# Patient Record
Sex: Male | Born: 1985 | Race: White | Hispanic: No | Marital: Single | Smoking: Current every day smoker
Health system: Southern US, Community
[De-identification: ages and names within clinical notes are randomized; demographics above are authoritative.]

## PROBLEM LIST (undated history)

## (undated) DIAGNOSIS — F32A Depression, unspecified: Secondary | ICD-10-CM

## (undated) HISTORY — DX: Depression, unspecified: F32.A

---

## 2020-03-22 ENCOUNTER — Emergency Department (HOSPITAL_COMMUNITY)
Admission: EM | Admit: 2020-03-22 | Discharge: 2020-03-23 | Disposition: A | Discharge status: Other Acute Inpatient Hospital | Payer: Self-pay | Attending: Emergency Medicine | Admitting: Emergency Medicine

## 2020-03-22 ENCOUNTER — Encounter (HOSPITAL_COMMUNITY): Payer: Self-pay

## 2020-03-22 ENCOUNTER — Emergency Department (HOSPITAL_COMMUNITY): Payer: Self-pay

## 2020-03-22 DIAGNOSIS — Z20822 Contact with and (suspected) exposure to covid-19: Secondary | ICD-10-CM | POA: Insufficient documentation

## 2020-03-22 DIAGNOSIS — R079 Chest pain, unspecified: Secondary | ICD-10-CM | POA: Insufficient documentation

## 2020-03-22 DIAGNOSIS — R45851 Suicidal ideations: Secondary | ICD-10-CM | POA: Insufficient documentation

## 2020-03-22 DIAGNOSIS — F1420 Cocaine dependence, uncomplicated: Secondary | ICD-10-CM | POA: Insufficient documentation

## 2020-03-22 DIAGNOSIS — F1410 Cocaine abuse, uncomplicated: Secondary | ICD-10-CM

## 2020-03-22 DIAGNOSIS — F329 Major depressive disorder, single episode, unspecified: Secondary | ICD-10-CM | POA: Insufficient documentation

## 2020-03-22 DIAGNOSIS — F17200 Nicotine dependence, unspecified, uncomplicated: Secondary | ICD-10-CM | POA: Insufficient documentation

## 2020-03-22 DIAGNOSIS — R0789 Other chest pain: Secondary | ICD-10-CM

## 2020-03-22 LAB — CBC
HCT: 37.7 % — ABNORMAL LOW (ref 39.0–52.0)
Hemoglobin: 12.1 g/dL — ABNORMAL LOW (ref 13.0–17.0)
MCH: 27.1 pg (ref 26.0–34.0)
MCHC: 32.1 g/dL (ref 30.0–36.0)
MCV: 84.5 fL (ref 80.0–100.0)
Platelets: 228 10*3/uL (ref 150–400)
RBC: 4.46 MIL/uL (ref 4.22–5.81)
RDW: 12.7 % (ref 11.5–15.5)
WBC: 8.7 10*3/uL (ref 4.0–10.5)
nRBC: 0 % (ref 0.0–0.2)

## 2020-03-22 NOTE — ED Triage Notes (Signed)
Pt BIB [facility] for eval of chest pain. 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 Pt reports he had a recent death in the family, got upset, noted chest pain and walked to the fire department. Upon entering the ambulance, pt requested they take him to [facility]. Paramedic declined and pt stated "well how close can you get me?". EMS reports pt slept soundly all the way here, denies CP on arrival.

## 2020-03-23 ENCOUNTER — Other Ambulatory Visit: Payer: Self-pay

## 2020-03-23 ENCOUNTER — Ambulatory Visit (HOSPITAL_COMMUNITY)
Admission: EM | Admit: 2020-03-23 | Discharge: 2020-03-23 | Payer: No Payment, Other | Attending: Psychiatry | Admitting: Psychiatry

## 2020-03-23 ENCOUNTER — Encounter (HOSPITAL_COMMUNITY): Payer: Self-pay

## 2020-03-23 DIAGNOSIS — F332 Major depressive disorder, recurrent severe without psychotic features: Secondary | ICD-10-CM | POA: Insufficient documentation

## 2020-03-23 DIAGNOSIS — F1490 Cocaine use, unspecified, uncomplicated: Secondary | ICD-10-CM | POA: Insufficient documentation

## 2020-03-23 DIAGNOSIS — Z635 Disruption of family by separation and divorce: Secondary | ICD-10-CM | POA: Insufficient documentation

## 2020-03-23 DIAGNOSIS — F17200 Nicotine dependence, unspecified, uncomplicated: Secondary | ICD-10-CM | POA: Insufficient documentation

## 2020-03-23 DIAGNOSIS — R45851 Suicidal ideations: Secondary | ICD-10-CM | POA: Insufficient documentation

## 2020-03-23 DIAGNOSIS — Z560 Unemployment, unspecified: Secondary | ICD-10-CM | POA: Insufficient documentation

## 2020-03-23 DIAGNOSIS — Z590 Homelessness: Secondary | ICD-10-CM | POA: Insufficient documentation

## 2020-03-23 DIAGNOSIS — Z915 Personal history of self-harm: Secondary | ICD-10-CM | POA: Insufficient documentation

## 2020-03-23 DIAGNOSIS — Z634 Disappearance and death of family member: Secondary | ICD-10-CM | POA: Insufficient documentation

## 2020-03-23 DIAGNOSIS — F1120 Opioid dependence, uncomplicated: Secondary | ICD-10-CM | POA: Insufficient documentation

## 2020-03-23 LAB — RAPID URINE DRUG SCREEN, HOSP PERFORMED
Amphetamines: NOT DETECTED
Barbiturates: NOT DETECTED
Benzodiazepines: NOT DETECTED
Cocaine: POSITIVE — AB
Opiates: NOT DETECTED
Tetrahydrocannabinol: NOT DETECTED

## 2020-03-23 LAB — TROPONIN I (HIGH SENSITIVITY)
Troponin I (High Sensitivity): 3 ng/L (ref <18)
Troponin I (High Sensitivity): 3 ng/L (ref <18)

## 2020-03-23 LAB — BASIC METABOLIC PANEL
Anion gap: 9 (ref 5–15)
BUN: 8 mg/dL (ref 6–20)
CO2: 27 mmol/L (ref 22–32)
Calcium: 8.8 mg/dL — ABNORMAL LOW (ref 8.9–10.3)
Chloride: 101 mmol/L (ref 98–111)
Creatinine, Ser: 1.16 mg/dL (ref 0.61–1.24)
GFR calc Af Amer: >60 mL/min (ref >60)
GFR calc non Af Amer: >60 mL/min (ref >60)
Glucose, Bld: 109 mg/dL — ABNORMAL HIGH (ref 70–99)
Potassium: 4.2 mmol/L (ref 3.5–5.1)
Sodium: 137 mmol/L (ref 135–145)

## 2020-03-23 LAB — SARS CORONAVIRUS 2 BY RT PCR (HOSPITAL ORDER, PERFORMED IN ~~LOC~~ HOSPITAL LAB): SARS Coronavirus 2: NEGATIVE

## 2020-03-23 LAB — ETHANOL: Alcohol, Ethyl (B): <10 mg/dL (ref <10)

## 2020-03-23 MED ORDER — ALUM & MAG HYDROXIDE-SIMETH 200-200-20 MG/5ML PO SUSP: 30.0000 mL | ORAL | Status: DC | PRN

## 2020-03-23 MED ORDER — ACETAMINOPHEN 325 MG PO TABS: 650.0000 mg | ORAL_TABLET | Freq: Four times a day (QID) | ORAL | Status: DC | PRN

## 2020-03-23 MED ORDER — GABAPENTIN 600 MG PO TABS
600.0000 mg | ORAL_TABLET | Freq: Three times a day (TID) | ORAL | Status: DC
  Administered 2020-03-23: 600 mg via ORAL
  Filled 2020-03-23: qty 1

## 2020-03-23 MED ORDER — MAGNESIUM HYDROXIDE 400 MG/5ML PO SUSP: 30.0000 mL | Freq: Every day | ORAL | Status: DC | PRN

## 2020-03-23 MED ORDER — SERTRALINE HCL 50 MG PO TABS
50.0000 mg | ORAL_TABLET | Freq: Every day | ORAL | Status: DC
  Administered 2020-03-23: 50 mg via ORAL
  Filled 2020-03-23: qty 1

## 2020-03-23 NOTE — BH Assessment (Signed)
Comprehensive Clinical Assessment (CCA) Screening, Triage and Referral Note  03/23/2020 Raymond Villarreal [id number]    Raymond Villarreal is a 34 y.o. male with a history of depression who presents to [facility] via [facility] for suicidal thoughts. Pt states that he has been feeling suicidal last few weeks due to recent death in his family, his nephew died 4 days ago. Pt also states that he is currently going through a divorce with wife over the last month as well. Pt states he is currently unemployed , homeless and recently discharged from [facility] a few weeks ago. Pt currently endorses SI with a plan to overdose on his sleeping medications. Pt reports 1 past SI attempt 1 year go. Pt denies HI, AVH, SIB. Pt reports current drug abuse of cocaine, states he last used a half a gram of cocaine last night, he states he occasionally uses but not daily. Pt reports past use of Heroin, states he has been clean 16 months, has been attending Methadone clinic states he goes daily has been taking 130 mg of methadone daily. Pt denies alcohol use. Reports that he is prescribed sertraline 50 mg daily, gabapentin 600 mg TID, and latuda. He is unsure of the dose for latuda. States that he receives his medications from [facility] in [place]. Chart review indicates that the patient was hospitalized at [facility] from 02/23/2020 to 02/29/2020 for depression/SI and then transferred to [facility] on 02/29/2020 for continued psychiatric treatment.  Pt reports depressive symptoms: anxiety, isolation, worthlessness, hopelessness, fatigue, loss of appetite. Pt reports getting 6 to 7 hours of sleep daily, with a poor appetite, no weight loss or gain. Pt denies any trauma/abuse, but reports family history of bipolar disorder and suicidal ideation on maternal side. Pt reports no current criminal charges, no access to weapons at this time, can not contract for safety.     Disposition: Nira Conn, FNP recommends pt for overnight  observation/contious assessment at [facility]  Diagnosis: MDD,recurrent, severe    Visit Diagnosis:    ICD-10-CM   1. Severe recurrent major depression without psychotic features [facility])  F33.2      Patient Reported Information How did you hear about [place]? Other (Comment) (EMS)  Referral name: EMS  Referral phone number: No data recorded  Whom do you see for routine medical problems? Hospital ER;I don't have a doctor  Practice/Facility Name: No data recorded Practice/Facility Phone Number: No data recorded Name of Contact: No data recorded Contact Number: No data recorded Contact Fax Number: No data recorded Prescriber Name: No data recorded Prescriber Address (if known): No data recorded  What Is the Reason for Your Visit/Call Today? suicidal  How Long Has This Been Causing You Problems? 1 wk - 1 month  What Do You Feel Would Help You the Most Today? Assessment Only   Have You Recently Been in Any Inpatient Treatment (Hospital/Detox/Crisis Center/28-Day Program)? No  Name/Location of Program/Hospital:No data recorded How Long Were You There? No data recorded When Were You Discharged? No data recorded  Have You Ever Received Services From [facility] Before? No  Who Do You See at [facility]? No data recorded  Have You Recently Had Any Thoughts About Hurting Yourself? Yes  Are You Planning to Commit Suicide/Harm Yourself At This time? Yes   Have you Recently Had Thoughts About Hurting Someone Karolee Ohs? No  Explanation: No data recorded  Have You Used Any Alcohol or Drugs in the Past 24 Hours? Yes  How Long Ago Did You Use Drugs  or Alcohol? No data recorded What Did You Use and How Much? cocaine about a half a gram   Do You Currently Have a Therapist/Psychiatrist? Yes  Name of Therapist/Psychiatrist: [facility]  Have You Been Recently Discharged From Any Office Practice or Programs? No  Explanation of Discharge From Practice/Program: No data recorded    CCA  Screening Triage Referral Assessment Type of Contact: Face-to-Face  Is this Initial or Reassessment? Initial Date Telepsych consult ordered in [facility]:  03/23/20 Time Telepsych consult ordered in [facility]:  0600  Patient Reported Information Reviewed? Yes  Patient Left Without Being Seen? NO Reason for Not Completing Assessment: No data recorded  Collateral Involvement: none   Does Patient Have a Court Appointed Legal Guardian? NO  Name and Contact of Legal Guardian: No data recorded If Minor and Not Living with Parent(s), Who has Custody? No data recorded Is CPS involved or ever been involved? Never  Is APS involved or ever been involved? Never   Patient Determined To Be At Risk for Harm To Self or Others Based on Review of Patient Reported Information or Presenting Complaint? No  Method: No data recorded Availability of Means: No data recorded Intent: No data recorded Notification Required: No data recorded Additional Information for Danger to Others Potential: No data recorded Additional Comments for Danger to Others Potential: No data recorded Are There Guns or Other Weapons in Your Home? No data recorded Types of Guns/Weapons: No data recorded Are These Weapons Safely Secured?                            No data recorded Who Could Verify You Are Able To Have These Secured: No data recorded Do You Have any Outstanding Charges, Pending Court Dates, Parole/Probation? No data recorded Contacted To Inform of Risk of Harm To Self or Others: No data recorded  Location of Assessment: [facility]  Does Patient Present under Involuntary Commitment? No  IVC Papers Initial File Date: No data recorded  [place] of Residence: [place]   Patient Currently Receiving the Following Services: Individual Therapy;Medication Management   Determination of Need: Urgent (48 hours)   Options For Referral: Overnight Observation    CCA Biopsychosocial  Intake/Chief Complaint:  CCA Intake With Chief  Complaint CCA Part Two Date: 03/23/20 CCA Part Two Time: 0601 Chief Complaint/Presenting Problem: suicidal (suicidal)  Mental Health Symptoms Depression:  Depression: Hopelessness, Irritability, Change in energy/activity, Difficulty Concentrating, Increase/decrease in appetite, Worthlessness, Duration of symptoms greater than two weeks  Mania:  Mania: None  Anxiety:   Anxiety: Difficulty concentrating, Worrying, Irritability  Psychosis:  Psychosis: None  Trauma:  Trauma: None  Obsessions:  Obsessions: None  Compulsions:  Compulsions: None  Inattention:  Inattention: None  Hyperactivity/Impulsivity:     Oppositional/Defiant Behaviors:  Oppositional/Defiant Behaviors: None  Emotional Irregularity:  Emotional Irregularity: None  Other Mood/Personality Symptoms:      Mental Status Exam Appearance and self-care  Stature:  Stature: Average  Weight:  Weight: Average weight  Clothing:  Clothing: Casual  Grooming:  Grooming: Normal  Cosmetic use:  Cosmetic Use: Age appropriate  Posture/gait:  Posture/Gait: Normal  Motor activity:  Motor Activity:  (NORMAL)  Sensorium  Attention:  Attention: Normal  Concentration:  Concentration: Normal  Orientation:  Orientation: Situation, Time, Person, Object  Recall/memory:  Recall/Memory: Normal  Affect and Mood  Affect:  Affect: Flat  Mood:  Mood: Euthymic  Relating  Eye contact:  Eye Contact: Normal  Facial expression:  Facial  Expression: Tense  Attitude toward examiner:  Attitude Toward Examiner: Cooperative  Thought and Language  Speech flow: Speech Flow: Clear and Coherent  Thought content:  Thought Content: Appropriate to Mood and Circumstances  Preoccupation:  Preoccupations: None  Hallucinations:  Hallucinations: None  Organization:     Company secretaryxecutive Functions  Fund of Knowledge:  Fund of Knowledge: Good  Intelligence:  Intelligence: Average  Abstraction:  Abstraction: Development worker, international aidConcrete  Judgement:  Judgement: Good  Reality Testing:  Reality  Testing: Adequate  Insight:  Insight: Good  Decision Making:  Decision Making: Normal  Social Functioning  Social Maturity:  Social Maturity: Responsible  Social Judgement:  Social Judgement: Normal  Stress  Stressors:  Stressors: Work, Engineer, civil (consulting)Housing, Surveyor, quantityinancial, Other (Comment), Grief/losses, Transitions  Coping Ability:  Coping Ability: Horticulturist, commercialxhausted  Skill Deficits:  Skill Deficits: None  Supports:  Supports: Support needed     Religion: Religion/Spirituality Are You A Religious Person?: Yes What is Your Religious Affiliation?: ChiropodistChristian  Leisure/Recreation: Leisure / Recreation Do You Have Hobbies?: No  Exercise/Diet: Exercise/Diet Do You Exercise?: Yes What Type of Exercise Do You Do?: Run/Walk How Many Times a Week Do You Exercise?: 1-3 times a week Have You Gained or Lost A Significant Amount of Weight in the Past Six Months?: No Do You Follow a Special Diet?: No Do You Have Any Trouble Sleeping?: No   CCA Employment/Education  Employment/Work Situation: Employment / Work Psychologist, occupationalituation Employment situation: Biomedical scientistUnemployed Patient's job has been impacted by current illness: No Has patient ever been in the Eli Lilly and Companymilitary?: No  Education: Education Is Patient Currently Attending School?: No Last Grade Completed: 8 Did Garment/textile technologistYou Graduate From [employer]High School?: No Did Theme park managerYou Attend College?: No Did Designer, television/film setYou Attend Graduate School?: No Did You Have An Individualized Education Program (IIEP): No Did You Have Any Difficulty At Progress EnergySchool?: No Patient's Education Has Been Impacted by Current Illness: No   CCA Family/Childhood History  Family and Relationship History: Family history Marital status: Single Are you sexually active?: No What is your sexual orientation?:  (Straight) Does patient have children?: No  Childhood History:  Childhood History By whom was/is the patient raised?: Mother, Father Does patient have siblings?: Yes Number of Siblings: 2 Description of patient's current relationship  with siblings: fair Did patient suffer any verbal/emotional/physical/sexual abuse as a child?: No Did patient suffer from severe childhood neglect?: No Has patient ever been sexually abused/assaulted/raped as an adolescent or adult?: No Was the patient ever a victim of a crime or a disaster?: No Witnessed domestic violence?: No Has patient been affected by domestic violence as an adult?: No  Child/Adolescent Assessment:     CCA Substance Use  Alcohol/Drug Use: Alcohol / Drug Use Pain Medications: see MAR History of alcohol / drug use?: Yes Substance #1 Name of Substance 1: Cocaine 1 - Age of First Use: 14 1 - Frequency: sometimes 1 - Last Use / Amount: yesterday/ half a gram Substance #2 Name of Substance 2: Heroin 2 - Age of First Use: 17 2 - Last Use / Amount: 16 months ago                     ASAM's:  Six Dimensions of Multidimensional Assessment  Dimension 1:  Acute Intoxication and/or Withdrawal Potential:      Dimension 2:  Biomedical Conditions and Complications:      Dimension 3:  Emotional, Behavioral, or Cognitive Conditions and Complications:     Dimension 4:  Readiness to Change:     Dimension 5:  Relapse, Continued use, or Continued Problem Potential:     Dimension 6:  Recovery/Living Environment:     ASAM Severity Score:    ASAM Recommended Level of Treatment:     Substance use Disorder (SUD) Substance Use Disorder (SUD)  Checklist Symptoms of Substance Use: Large amounts of time spent to obtain, use or recover from the substance(s)  Recommendations for Services/Supports/Treatments: Recommendations for Services/Supports/Treatments Recommendations For Services/Supports/Treatments: Other (Comment)  DSM5 Diagnoses: There are no problems to display for this patient.   Patient Centered Plan: Patient is on the following Treatment Plan(s):     Referrals to Alternative Service(s): Referred to Alternative Service(s):   Place:   Date:   Time:     Referred to Alternative Service(s):   Place:   Date:   Time:    Referred to Alternative Service(s):   Place:   Date:   Time:    Referred to Alternative Service(s):   Place:   Date:   Time:      Natasha Mead, LCSWA

## 2020-03-23 NOTE — Progress Notes (Signed)
Received Raymond Villarreal this Am asleep in his chair bed, he woke up to inquire about his methadone  Confirmation. Later he spoke with the NP and the tentative plan is to transport him to his methadone clinic in [place]. He was compliant with dis scheduled medications.

## 2020-03-23 NOTE — ED Provider Notes (Signed)
[facility] EMERGENCY DEPARTMENT Provider Note   CSN: [id number] Arrival date & time: 03/22/20  2250     History Chief Complaint  Patient presents with  . Chest Pain  . Suicidal    Raymond Villarreal is a 34 y.o. male with a history of depression who presents to the emergency department by [facility] with a chief complaint of suicidal thoughts.  The patient reports that he has been increasingly suicidal over the last few weeks.  Reports stressors that include the recent death of his nephew.  The patient reports that he is also going through a divorce.  He has had an associated plan to overdose on sleeping medications.  He reports a history of previous suicide attempt where he intentionally overdosed on medication and ultimately required behavioral health admission.  He denies HI or auditory or visual hallucinations.  He reports social alcohol use and tobacco use.  He has a history of cocaine use.  Reports that he has been having intermittent chest pain for the last few days.  He characterizes the pain as tightness.  No chest pain at this time.  Reports that the pain is worse when he is feeling anxious.  No shortness of breath, URI symptoms, fever, chills, back pain, abdominal pain, nausea, vomiting, diarrhea.  The history is provided by the patient and medical records. No language interpreter was used.       Past Medical History:  Diagnosis Date  . Depression     There are no problems to display for this patient.   History reviewed. No pertinent surgical history.     No family history on file.  Social History   Tobacco Use  . Smoking status: Current Every Day Smoker  . Smokeless tobacco: Never Used  Substance Use Topics  . Alcohol use: Yes  . Drug use: Yes    Home Medications Prior to Admission medications   Not on File    Allergies    Patient has no known allergies.  Review of Systems   Review of Systems  Constitutional: Negative for  appetite change and fever.  HENT: Negative for congestion and sore throat.   Eyes: Negative for visual disturbance.  Respiratory: Negative for shortness of breath and wheezing.   Cardiovascular: Positive for chest pain. Negative for palpitations.  Gastrointestinal: Negative for abdominal pain, diarrhea, nausea and vomiting.  Genitourinary: Negative for dysuria.  Musculoskeletal: Negative for back pain.  Skin: Negative for rash.  Allergic/Immunologic: Negative for immunocompromised state.  Neurological: Negative for seizures, syncope, weakness, numbness and headaches.  Psychiatric/Behavioral: Positive for dysphoric mood and suicidal ideas. Negative for confusion, hallucinations and self-injury. The patient is nervous/anxious.     Physical Exam Updated Vital Signs BP (!) 95/58 (BP Location: Left Arm)   Pulse 70   Temp 98 F (36.7 C) (Oral)   Resp 16   Ht 5\' 7"  (1.702 m)   Wt 59 kg   SpO2 96%   BMI 20.36 kg/m   Physical Exam Vitals and nursing note reviewed.  Constitutional:      General: He is not in acute distress.    Appearance: He is well-developed. He is not ill-appearing, toxic-appearing or diaphoretic.  HENT:     Head: Normocephalic.  Eyes:     Conjunctiva/sclera: Conjunctivae normal.  Cardiovascular:     Rate and Rhythm: Normal rate and regular rhythm.     Pulses: Normal pulses.     Heart sounds: Normal heart sounds. No murmur heard.  No  friction rub. No gallop.   Pulmonary:     Effort: Pulmonary effort is normal. No respiratory distress.     Breath sounds: No stridor. No wheezing, rhonchi or rales.  Chest:     Chest wall: No tenderness.  Abdominal:     General: There is no distension.     Palpations: Abdomen is soft. There is no mass.     Tenderness: There is no abdominal tenderness. There is no right CVA tenderness, left CVA tenderness, guarding or rebound.     Hernia: No hernia is present.  Musculoskeletal:     Cervical back: Neck supple.  Skin:     General: Skin is warm and dry.  Neurological:     Mental Status: He is alert.  Psychiatric:        Attention and Perception: He does not perceive auditory or visual hallucinations.        Mood and Affect: Mood is depressed.        Speech: Speech normal.        Behavior: Behavior normal. Behavior is not aggressive or hyperactive.        Thought Content: Thought content includes suicidal ideation. Thought content does not include homicidal ideation. Thought content includes suicidal plan. Thought content does not include homicidal plan.     ED Results / Procedures / Treatments   Labs (all labs ordered are listed, but only abnormal results are displayed) Labs Reviewed  BASIC METABOLIC PANEL - Abnormal; Notable for the following components:      Result Value   Glucose, Bld 109 (*)    Calcium 8.8 (*)    All other components within normal limits  CBC - Abnormal; Notable for the following components:   Hemoglobin 12.1 (*)    HCT 37.7 (*)    All other components within normal limits  RAPID URINE DRUG SCREEN, HOSP PERFORMED - Abnormal; Notable for the following components:   Cocaine POSITIVE (*)    All other components within normal limits  SARS CORONAVIRUS 2 BY RT PCR (HOSPITAL ORDER, PERFORMED IN [facility])  ETHANOL  TROPONIN I (HIGH SENSITIVITY)  TROPONIN I (HIGH SENSITIVITY)    EKG EKG Interpretation  Date/Time:  Wednesday March 22 2020 22:59:50 EDT Ventricular Rate:  79 PR Interval:  120 QRS Duration: 84 QT Interval:  372 QTC Calculation: 426 R Axis:   81 Text Interpretation: Normal sinus rhythm Minimal voltage criteria for LVH, may be normal variant ( Sokolow-Lyon ) Borderline ECG No previous ECGs available Confirmed by Zadie Rhine ([id number]) on 03/23/2020 3:58:35 AM   Radiology DG Chest 2 View  Result Date: 03/22/2020 CLINICAL DATA:  Chest pain EXAM: CHEST - 2 VIEW COMPARISON:  None. FINDINGS: The heart size and mediastinal contours are within normal  limits. Both lungs are clear. The visualized skeletal structures are unremarkable. IMPRESSION: No active cardiopulmonary disease. Electronically Signed   By: Deatra Robinson M.D.   On: 03/22/2020 23:26    Procedures Procedures (including critical care time)  Medications Ordered in ED Medications - No data to display  ED Course  I have reviewed the triage vital signs and the nursing notes.  Pertinent labs & imaging results that were available during my care of the patient were reviewed by me and considered in my medical decision making (see chart for details).    MDM Rules/Calculators/A&P  34 year old male with a history of depression presenting by EMS with worsening suicidal ideation.  He has a plan to overdose on sleep medications.  Reports a history of previous suicide attempt with similar overdose attempts.  He has a history of previous behavioral health inpatient admission per the patient.  Vital signs are normal on arrival.  He has been having intermittent chest pain the last few days.   EKG with normal sinus rhythm.  Delta troponin is not elevated.  No electrolyte derangements.  UDS is positive for cocaine.  Patient is pain-free at this time.  Reports a history of a previous MI.  Doubt ACS, PE, aortic dissection, or tension pneumothorax.  Patient is medically cleared at this time.  COVID-19 test is pending.  Will discuss patient with [facility] provider for transfer.   I spoke with [facility] provider, Nira Conn, who accepts patient to [facility].  EMTALA has been completed by Dr. Bebe Shaggy, attending physician.  COVID-19 test is negative.  Patient is medically stable and in no acute distress.  Safe for transfer to [facility] at this time.  Please see behavioral health notes for further work-up and evaluation.   Final Clinical Impression(s) / ED Diagnoses Final diagnoses:  Suicidal ideation  Cocaine use disorder (HCC)  Atypical chest pain    Rx / DC Orders ED Discharge  Orders    None       Kayveon Lennartz A, PA-C 03/23/20 0504    Zadie Rhine, MD 03/23/20 [phone]

## 2020-03-23 NOTE — Progress Notes (Signed)
Methadone verified with [facility] at [id number] ext [phone].  Patient received 130 mg methadone on 03/22/20 and is an active pt in the methadone treatment program.   Peggye Fothergill, Pharm D

## 2020-03-23 NOTE — ED Notes (Signed)
Pt states that he is now feeling SI, states that he is going thru a divorce and recently lost his nephew, pt reports that he has not been taking his medication for the past week. Denies HI/AVH. Pt changed into purple scrubs.

## 2020-03-23 NOTE — Discharge Instructions (Signed)

## 2020-03-23 NOTE — ED Provider Notes (Signed)
FBC/OBS ASAP Discharge Summary  Date and Time: 03/23/2020 9:28 AM  Name: Raymond Villarreal  MRN:  [id number]   Discharge Diagnoses:  Final diagnoses:  Severe recurrent major depression without psychotic features (HCC)    Subjective: Patient states "I have been depressed, I recently lost my nephew."  Patient reports he traveled to [facility] area to attend wake for deceased nephew on yesterday.  Patient reports his concern today is transportation to his methadone clinic at [facility] in [place].  Patient reports he is homeless in the [place] area.  Patient denies access to weapons.  Patient reports he is currently unemployed.  Patient denies alcohol and substance use.  Patient reports he is clean from heroin for 16 months and treated with methadone at [facility] in [place].  Patient reports last methadone dose on yesterday.  Patient endorses chronic suicidal ideations.  Patient denies plan or intent to harm self.  Patient verbally contracts for this Clinical research associate for safety, states "I will be fine as long as I make it to my clinic appointment, I need to be there by 1230." Patient denies self-harm behaviors.  Patient reports 1 previous suicide attempt approximately 1 year ago.  Patient denies homicidal ideations.  Patient denies auditory and visual hallucinations.  There is no evidence of delusional thought content and no indication that patient is responding to internal stimuli.  On evaluation patient is alert and oriented x4.  Patient assessed by nurse practitioner.  Patient pleasant cooperative with assessment.  Patient endorses 8 hours of sleep or more per day and average appetite.  Patient denies any person to contact for collateral information.  Stay Summary:  Per TTS assessment:Raymond Butleris a 34 y.o.malewith a history of depression who presents to [facility] via [facility] for suicidal thoughts. Pt states that he has been feeling suicidal last few weeks due to recent death  in his family, his nephew died 4 days ago. Pt also states that he is currently going through a divorce with wife over the last month as well. Pt states he is currently unemployed , homeless and recently discharged from [facility] a few weeks ago. Pt currently endorses SI with a plan to overdose on his sleeping medications. Pt reports 1 past SI attempt 1 year go. Pt denies HI, AVH, SIB. Pt reports current drug abuse of cocaine, states he last used a half a gram of cocaine last night, he states he occasionally uses but not daily. Pt reports past use of Heroin, states he has been clean 16 months, has been attending Methadone clinic states he goes daily has been taking 130 mg of methadone daily. Pt denies alcohol use. Reports that he is prescribed sertraline 50 mg daily, gabapentin 600 mg TID, and latuda. He is unsure of the dose for latuda. States that he receives his medications from [facility] in [place].Chart review indicates that the patient was hospitalized at [facility] from 02/23/2020 to 02/29/2020 for depression/SIand then transferred to [facility] on 02/29/2020 for continued psychiatric treatment.  Total Time spent with patient: 30 minutes  Past Psychiatric History: Major depressive disorder, heroin use disorder Past Medical History:  Past Medical History:  Diagnosis Date  . Depression    History reviewed. No pertinent surgical history. Family History: History reviewed. No pertinent family history. Family Psychiatric History: None reported Social History:  Social History   Substance and Sexual Activity  Alcohol Use Yes     Social History   Substance and Sexual Activity  Drug  Use Yes    Social History   Socioeconomic History  . Marital status: Single    Spouse name: Not on file  . Number of children: Not on file  . Years of education: Not on file  . Highest education level: Not on file  Occupational History  . Not on file  Tobacco Use  . Smoking status: Current Every  Day Smoker  . Smokeless tobacco: Never Used  Substance and Sexual Activity  . Alcohol use: Yes  . Drug use: Yes  . Sexual activity: Not on file  Other Topics Concern  . Not on file  Social History Narrative  . Not on file   Social Determinants of Health   Financial Resource Strain:   . Difficulty of Paying Living Expenses: Not on file  Food Insecurity:   . Worried About Programme researcher, broadcasting/film/video in the Last Year: Not on file  . Ran Out of Food in the Last Year: Not on file  Transportation Needs:   . Lack of Transportation (Medical): Not on file  . Lack of Transportation (Non-Medical): Not on file  Physical Activity:   . Days of Exercise per Week: Not on file  . Minutes of Exercise per Session: Not on file  Stress:   . Feeling of Stress : Not on file  Social Connections:   . Frequency of Communication with Friends and Family: Not on file  . Frequency of Social Gatherings with Friends and Family: Not on file  . Attends Religious Services: Not on file  . Active Member of Clubs or Organizations: Not on file  . Attends Banker Meetings: Not on file  . Marital Status: Not on file   SDOH:  SDOH Screenings   Alcohol Screen:   . Last Alcohol Screening Score (AUDIT): Not on file  Depression (PHQ2-9): Medium Risk  . PHQ-2 Score: 12  Financial Resource Strain:   . Difficulty of Paying Living Expenses: Not on file  Food Insecurity:   . Worried About Programme researcher, broadcasting/film/video in the Last Year: Not on file  . Ran Out of Food in the Last Year: Not on file  Housing:   . Last Housing Risk Score: Not on file  Physical Activity:   . Days of Exercise per Week: Not on file  . Minutes of Exercise per Session: Not on file  Social Connections:   . Frequency of Communication with Friends and Family: Not on file  . Frequency of Social Gatherings with Friends and Family: Not on file  . Attends Religious Services: Not on file  . Active Member of Clubs or Organizations: Not on file  .  Attends Banker Meetings: Not on file  . Marital Status: Not on file  Stress:   . Feeling of Stress : Not on file  Tobacco Use: High Risk  . Smoking Tobacco Use: Current Every Day Smoker  . Smokeless Tobacco Use: Never Used  Transportation Needs:   . Freight forwarder (Medical): Not on file  . Lack of Transportation (Non-Medical): Not on file    Has this patient used any form of tobacco in the last 30 days? (Cigarettes, Smokeless Tobacco, Cigars, and/or Pipes) A prescription for an FDA-approved tobacco cessation medication was offered at discharge and the patient refused  Current Medications:  Current Facility-Administered Medications  Medication Dose Route Frequency Provider Last Rate Last Admin  . acetaminophen (TYLENOL) tablet 650 mg  650 mg Oral Q6H PRN Jackelyn Poling, NP      .  alum & mag hydroxide-simeth (MAALOX/MYLANTA) 200-200-20 MG/5ML suspension 30 mL  30 mL Oral Q4H PRN Nira ConnBerry, Jason A, NP      . gabapentin (NEURONTIN) tablet 600 mg  600 mg Oral TID Nira ConnBerry, Jason A, NP      . magnesium hydroxide (MILK OF MAGNESIA) suspension 30 mL  30 mL Oral Daily PRN Nira ConnBerry, Jason A, NP      . sertraline (ZOLOFT) tablet 50 mg  50 mg Oral Daily Jackelyn PolingBerry, Jason A, NP       Current Outpatient Medications  Medication Sig Dispense Refill  . gabapentin (NEURONTIN) 600 MG tablet Take 600 mg by mouth.    . methadone (DOLOPHINE) 10 MG/ML solution Take 130 mg by mouth daily.    . sertraline (ZOLOFT) 50 MG tablet Take 50 mg by mouth daily.      PTA Medications: (Not in a hospital admission)   Musculoskeletal  Strength & Muscle Tone: within normal limits Gait & Station: normal Patient leans: N/A  Psychiatric Specialty Exam  Presentation  General Appearance: Appropriate for Environment;Casual;Fairly Groomed  Eye Contact:Minimal  Speech:Clear and Coherent;Normal Rate  Speech Volume:Decreased  Handedness:Right   Mood and Affect   Mood:Anxious;Depressed;Hopeless;Worthless  Affect:Congruent;Depressed   Advertising account plannerThought Process  Thought Processes:Coherent;Goal Directed;Linear  Descriptions of Associations:Intact  Orientation:Full (Time, Place and Person)  Thought Content:WDL  Hallucinations:Hallucinations: None  Ideas of Reference:None  Suicidal Thoughts:Suicidal Thoughts: Yes, Active SI Active Intent and/or Plan: With Intent;With Plan;With Means to Carry Out  Homicidal Thoughts:Homicidal Thoughts: No   Sensorium  Memory:Immediate Good;Recent Good;Remote Good  Judgment:Intact  Insight:Fair   Executive Functions  Concentration:Fair  Attention Span:Fair  Recall:Good  Fund of Knowledge:Good  Language:Good   Psychomotor Activity  Psychomotor Activity:Psychomotor Activity: Normal   Assets  Assets:Communication Skills;Desire for Improvement;Physical Health   Sleep  Sleep:Sleep: Fair   Physical Exam  Physical Exam Vitals and nursing note reviewed.  Constitutional:      Appearance: He is well-developed.  HENT:     Head: Normocephalic.  Cardiovascular:     Rate and Rhythm: Normal rate.  Pulmonary:     Effort: Pulmonary effort is normal.  Neurological:     Mental Status: He is alert and oriented to person, place, and time.  Psychiatric:        Attention and Perception: Attention and perception normal.        Mood and Affect: Mood and affect normal.        Speech: Speech normal.        Behavior: Behavior normal. Behavior is cooperative.        Thought Content: Thought content includes suicidal ideation.        Cognition and Memory: Cognition and memory normal.        Judgment: Judgment normal.    Review of Systems  Constitutional: Negative.   HENT: Negative.   Eyes: Negative.   Respiratory: Negative.   Cardiovascular: Negative.   Gastrointestinal: Negative.   Genitourinary: Negative.   Musculoskeletal: Negative.   Skin: Negative.   Neurological: Negative.    Endo/Heme/Allergies: Negative.   Psychiatric/Behavioral: Positive for suicidal ideas.   Blood pressure (!) 150/71, pulse 60, temperature 98.3 F (36.8 C), temperature source Oral, resp. rate 18, SpO2 100 %. There is no height or weight on file to calculate BMI.  Demographic Factors:  Male and Caucasian  Loss Factors: NA  Historical Factors: NA  Risk Reduction Factors:   Positive social support, Positive therapeutic relationship and Positive coping skills or problem solving skills  Continued Clinical  Symptoms:    Cognitive Features That Contribute To Risk:  None    Suicide Risk:  Minimal: No identifiable suicidal ideation.  Patients presenting with no risk factors but with morbid ruminations; may be classified as minimal risk based on the severity of the depressive symptoms  Plan Of Care/Follow-up recommendations:  Other:  Follow-up with established outpatient psychiatric provider at [facility], [facility]  Social work to assist with transportation as requested by patient.  Disposition: Patient reviewed with Dr. Nelly Rout. Discharge  Patrcia Dolly, FNP 03/23/2020, 9:28 AM

## 2020-03-23 NOTE — ED Notes (Signed)
Patient belongings in [facility] [address]

## 2020-03-23 NOTE — ED Provider Notes (Signed)
Patient seen/examined in the Emergency Department in conjunction with Advanced Practice Provider McDonald Patient reports chest pain due to anxiety, but now reporting suicidal thoughts Exam : Awake alert, no acute distress.  Denies active chest pain.  No murmurs noted on exam, lung sounds clear Plan: Patient is medically stable.  He can be transferred to behavioral health urgent care    Zadie Rhine, MD 03/23/20 [phone]

## 2020-03-23 NOTE — ED Triage Notes (Signed)
Pt transferred from [facility] d/t SI. Reports going through divorce, recent loss of nephew as stressors. + SI with plan to OD

## 2020-03-23 NOTE — Progress Notes (Addendum)
[facility] arrived to take [place] to [place] at 1150 hrs. He received his AVS and personal belongings.

## 2020-03-23 NOTE — ED Notes (Signed)
Patient offered drink.

## 2020-03-23 NOTE — ED Provider Notes (Signed)
Behavioral Health Admission H&P [facility](FBC & OBS)  Date: 03/23/20 Patient Name: Raymond Villarreal MRN: [id number] Chief Complaint:  Chief Complaint  Patient presents with  . Suicidal   Chief Complaint/Presenting Problem: suicidal (suicidal)  Diagnoses:  Final diagnoses:  Severe recurrent major depression without psychotic features (HCC)    HPI: Raymond Villarreal is a 34 y.o. male with a history of MDD, heroin use disorder, cocaine use disorder who [facility] to chest pain. Patient reports that his nephew died last week and that he traveled to [place] from [place] to [facility]. He walked to a fire department and reported chest pain. Per notes, patient requested paramedics to transport him to [place]. He reports worsening depression over the past few weeks. Patient states that he is having suicidal ideations with thoughts of overdosing on sleep medications.  Reports a history of one previous SA by overdose about a year ago. Reports that he was admitted to an inpatient behavioral health facility at that time but he does not recall the name. Patient denies homicidal ideations. Denies auditory and visual hallucinations. Does not appear to be responding to internal stimuli. No evidence of delusional thought content. Reports that he has not used heroin in 14 months. He receives methadone 130 mg daily from [facility] in [place]. Reports occasional use of cocaine with last use yesterday. Rarely drinks alcohol. Denies use of other substances. Urine drug screen positive for cocaine.  Reports that he is prescribed sertraline 50 mg daily, gabapentin 600 mg TID, and latuda. He is unsure of the dose for latuda. States that he receives his medications from [facility] in [place]. Chart review indicates that the patient was hospitalized at [facility] from 02/23/2020 to 02/29/2020 for depression/SI and then [facility]/2020 for continued psychiatric treatment.   PHQ 2-9:      Total Time spent with patient: 20 minutes  Musculoskeletal  Strength & Muscle Tone: within normal limits Gait & Station: normal Patient leans: N/A  Psychiatric Specialty Exam  Presentation General Appearance: Appropriate for Environment;Casual;Fairly Groomed  Eye Contact:Minimal  Speech:Clear and Coherent;Normal Rate  Speech Volume:Decreased  Handedness:Right   Mood and Affect  Mood:Anxious;Depressed;Hopeless;Worthless  Affect:Congruent;Depressed   Advertising account plannerThought Process  Thought Processes:Coherent;Goal Directed;Linear  Descriptions of Associations:Intact  Orientation:Full (Time, Place and Person)  Thought Content:WDL  Hallucinations:Hallucinations: None  Ideas of Reference:None  Suicidal Thoughts:Suicidal Thoughts: Yes, Active SI Active Intent and/or Plan: With Intent;With Plan;With Means to Carry Out  Homicidal Thoughts:Homicidal Thoughts: No   Sensorium  Memory:Immediate Good;Recent Good;Remote Good  Judgment:Intact  Insight:Fair   Executive Functions  Concentration:Fair  Attention Span:Fair  Recall:Good  Fund of Knowledge:Good   Language:Good   Psychomotor Activity  Psychomotor Activity:Psychomotor Activity: Normal   Assets  Assets:Communication Skills;Desire for Improvement;Physical Health   Sleep  Sleep:Sleep: Fair   Physical Exam Constitutional:      General: He is not in acute distress.    Appearance: He is not ill-appearing, toxic-appearing or diaphoretic.  HENT:     Head: Normocephalic.     Right Ear: External ear normal.     Left Ear: External ear normal.  Eyes:     Pupils: Pupils are equal, round, and reactive to light.  Cardiovascular:     Rate and Rhythm: Normal rate.  Pulmonary:     Effort: Pulmonary effort is normal. No respiratory distress.  Musculoskeletal:        General: Normal range of motion.  Skin:    General: Skin is warm and dry.  Neurological:     Mental Status: He is alert and oriented to person,  place, and time.  Psychiatric:        Mood and Affect: Mood is anxious and depressed.        Speech: Speech normal.        Behavior: Behavior is cooperative.        Thought Content: Thought content is not paranoid or delusional. Thought content includes suicidal ideation. Thought content does not include homicidal ideation. Thought content includes suicidal plan.    Review of Systems  Constitutional: Negative for chills, diaphoresis, fever, malaise/fatigue and weight loss.  HENT: Negative for congestion.   Respiratory: Negative for cough and shortness of breath.   Cardiovascular: Negative for chest pain and palpitations.  Gastrointestinal: Negative for diarrhea, nausea and vomiting.  Neurological: Negative for dizziness and seizures.  Psychiatric/Behavioral: Positive for depression, substance abuse and suicidal ideas. Negative for hallucinations and memory loss. The patient is nervous/anxious and has insomnia.   All other systems reviewed and are negative.   Blood pressure 97/69, pulse 65, temperature (!) 96.9 F (36.1 C), temperature source Oral, resp. rate 16, SpO2 98 %. There is no height or weight on file to calculate BMI.  Past Psychiatric History: MDD, Opiate use disorder, cocaine use disorder. One previous suicide attempt.  Is the patient at risk to self? Yes  Has the patient been a risk to self in the past 6 months? Yes .    Has the patient been a risk to self within the distant past? Yes   Is the patient a risk to others? No   Has the patient been a risk to others in the past 6 months? No   Has the patient been a risk to others within the distant past? No   Past Medical History:  Past Medical History:  Diagnosis Date  . Depression    No past surgical history on file.  Family History: No family history on file.  Social History:  Social History   Socioeconomic History  . Marital status: Single    Spouse name: Not on file  . Number of children: Not on file  . Years  of education: Not on file  . Highest education level: Not on file  Occupational History  . Not on file  Tobacco Use  . Smoking status: Current Every Day Smoker  . Smokeless tobacco: Never Used  Substance and Sexual Activity  . Alcohol use: Yes  . Drug use: Yes  . Sexual activity: Not on file  Other Topics Concern  . Not on file  Social History Narrative  . Not on file   Social Determinants of Health   Financial Resource Strain:   . Difficulty of Paying Living Expenses: Not on file  Food Insecurity:   . Worried About Programme researcher, broadcasting/film/video in the Last Year: Not on file  . Ran Out of Food in the Last Year: Not on file  Transportation Needs:   . Lack of Transportation (Medical): Not on file  . Lack of Transportation (Non-Medical): Not on file  Physical Activity:   . Days of Exercise per Week: Not on file  . Minutes of Exercise per Session: Not on file  Stress:   . Feeling of Stress : Not on file  Social Connections:   . Frequency of Communication with Friends and Family: Not on file  . Frequency of Social Gatherings with Friends and Family: Not on file  . Attends Religious Services: Not on  file  . Active Member of Clubs or Organizations: Not on file  . Attends Banker Meetings: Not on file  . Marital Status: Not on file  Intimate Partner Violence:   . Fear of Current or Ex-Partner: Not on file  . Emotionally Abused: Not on file  . Physically Abused: Not on file  . Sexually Abused: Not on file    SDOH:  SDOH Screenings   Alcohol Screen:   . Last Alcohol Screening Score (AUDIT): Not on file  Depression (PHQ2-9):   . PHQ-2 Score: Not on file  Financial Resource Strain:   . Difficulty of Paying Living Expenses: Not on file  Food Insecurity:   . Worried About Programme researcher, broadcasting/film/video in the Last Year: Not on file  . Ran Out of Food in the Last Year: Not on file  Housing:   . Last Housing Risk Score: Not on file  Physical Activity:   . Days of Exercise per  Week: Not on file  . Minutes of Exercise per Session: Not on file  Social Connections:   . Frequency of Communication with Friends and Family: Not on file  . Frequency of Social Gatherings with Friends and Family: Not on file  . Attends Religious Services: Not on file  . Active Member of Clubs or Organizations: Not on file  . Attends Banker Meetings: Not on file  . Marital Status: Not on file  Stress:   . Feeling of Stress : Not on file  Tobacco Use: High Risk  . Smoking Tobacco Use: Current Every Day Smoker  . Smokeless Tobacco Use: Never Used  Transportation Needs:   . Freight forwarder (Medical): Not on file  . Lack of Transportation (Non-Medical): Not on file    Last Labs:  Admission on 03/22/2020, Discharged on 03/23/2020  Component Date Value Ref Range Status  . Sodium 03/22/2020 137  135 - 145 mmol/L Final  . Potassium 03/22/2020 4.2  3.5 - 5.1 mmol/L Final  . Chloride 03/22/2020 101  98 - 111 mmol/L Final  . CO2 03/22/2020 27  22 - 32 mmol/L Final  . Glucose, Bld 03/22/2020 109* 70 - 99 mg/dL Final   Glucose reference range applies only to samples taken after fasting for at least 8 hours.  . BUN 03/22/2020 8  6 - 20 mg/dL Final  . Creatinine, Ser 03/22/2020 1.16  0.61 - 1.24 mg/dL Final  . Calcium 16/04/9603 8.8* 8.9 - 10.3 mg/dL Final  . GFR calc non Af Amer 03/22/2020 >60  >60 mL/min Final  . GFR calc Af Amer 03/22/2020 >60  >60 mL/min Final  . Anion gap 03/22/2020 9  5 - 15 Final   Performed at [facility], [address] [place], [place] [address]  . WBC 03/22/2020 8.7  4.0 - 10.5 K/uL Final  . RBC 03/22/2020 4.46  4.22 - 5.81 MIL/uL Final  . Hemoglobin 03/22/2020 12.1* 13.0 - 17.0 g/dL Final  . HCT 11/91/4782 37.7* 39 - 52 % Final  . MCV 03/22/2020 84.5  80.0 - 100.0 fL Final  . MCH 03/22/2020 27.1  26.0 - 34.0 pg Final  . MCHC 03/22/2020 32.1  30.0 - 36.0 g/dL Final  . RDW 95/62/1308 12.7  11.5 - 15.5 % Final  . Platelets 03/22/2020  228  150 - 400 K/uL Final  . nRBC 03/22/2020 0.0  0.0 - 0.2 % Final   Performed at [facility], [address], [place], [place]  [address]  . Troponin I (High Sensitivity) 03/22/2020 3  <18 ng/L Final   Comment: (NOTE) Elevated high sensitivity troponin I (hsTnI) values and significant  changes across serial measurements may suggest ACS but many other  chronic and acute conditions are known to elevate hsTnI results.  Refer to the "Links" section for chest pain algorithms and additional  guidance. Performed at [facility], [address] [place], [place] [address]   . Troponin I (High Sensitivity) 03/23/2020 3  <18 ng/L Final   Comment: (NOTE) Elevated high sensitivity troponin I (hsTnI) values and significant  changes across serial measurements may suggest ACS but many other  chronic and acute conditions are known to elevate hsTnI results.  Refer to the "Links" section for chest pain algorithms and additional  guidance. Performed at [facility], [address], [place], [place] [address]   . Alcohol, Ethyl (B) 03/23/2020 <10  <10 mg/dL Final   Comment: (NOTE) Lowest detectable limit for serum alcohol is 10 mg/dL.  For medical purposes only. Performed at [facility], [address] [place], [place] [address]   . Opiates 03/23/2020 NONE DETECTED  NONE DETECTED Final  . Cocaine 03/23/2020 POSITIVE* NONE DETECTED Final  . Benzodiazepines 03/23/2020 NONE DETECTED  NONE DETECTED Final  . Amphetamines 03/23/2020 NONE DETECTED  NONE DETECTED Final  . Tetrahydrocannabinol 03/23/2020 NONE DETECTED  NONE DETECTED Final  . Barbiturates 03/23/2020 NONE DETECTED  NONE DETECTED Final   Comment: (NOTE) DRUG SCREEN FOR MEDICAL PURPOSES ONLY.  IF CONFIRMATION IS NEEDED FOR ANY PURPOSE, NOTIFY LAB WITHIN 5 DAYS.  LOWEST DETECTABLE LIMITS FOR URINE DRUG SCREEN Drug Class                     Cutoff (ng/mL) Amphetamine and metabolites    1000 Barbiturate and  metabolites    200 Benzodiazepine                 200 Tricyclics and metabolites     300 Opiates and metabolites        300 Cocaine and metabolites        300 THC                            50 Performed at [facility], [address], [place], [place] [address]   . SARS Coronavirus 2 03/23/2020 NEGATIVE  NEGATIVE Final   Comment: (NOTE) SARS-CoV-2 target nucleic acids are NOT DETECTED.  The SARS-CoV-2 RNA is generally detectable in upper and lower respiratory specimens during the acute phase of infection. The lowest concentration of SARS-CoV-2 viral copies this assay can detect is 250 copies / mL. A negative result does not preclude SARS-CoV-2 infection and should not be used as the sole basis for treatment or other patient management decisions.  A negative result may occur with improper specimen collection / handling, submission of specimen other than nasopharyngeal swab, presence of viral mutation(s) within the areas targeted by this assay, and inadequate number of viral copies (<250 copies / mL). A negative result must be combined with clinical observations, patient history, and epidemiological information.  Fact Sheet for Patients:   BoilerBrush.com.cy  Fact Sheet for Healthcare Providers: https://pope.com/  This test is not yet approved or                           cleared by the [place] FDA and has been authorized for detection and/or diagnosis of SARS-CoV-2 by FDA under an Emergency Use Authorization (EUA).  This EUA will remain in effect (meaning this test can be used) for the duration of the COVID-19 declaration under Section 564(b)(1) of the Act, 21 U.S.C. section 360bbb-3(b)(1), unless the authorization is terminated or revoked sooner.  Performed at [facility], [address] [place], [place] [address]     Allergies: Patient has no known allergies.  PTA Medications: (Not in a hospital  admission)   Medical Decision Making  Medically cleared in the emergency department  Resume gabapentin 600 mg TID for substance withdrawal/cravings Resume sertraline 50 mg daily for depression    Recommendations  Based on my evaluation the patient does not appear to have an emergency medical condition.   Patient will be placed in the continuous assessment area at [facility] for treatment and stabilization. He will be reevaluated on 03/23/2020. The treatment team will determine disposition at that time.     Jackelyn Poling, NP 03/23/20  6:07 AM

## 2021-09-29 IMAGING — CR DG CHEST 2V
2 series · 2 of 2 positions shown · non-contrast
Comparison: None.

CLINICAL DATA: Chest pain

EXAM:
CHEST - 2 VIEW

[chest pa]
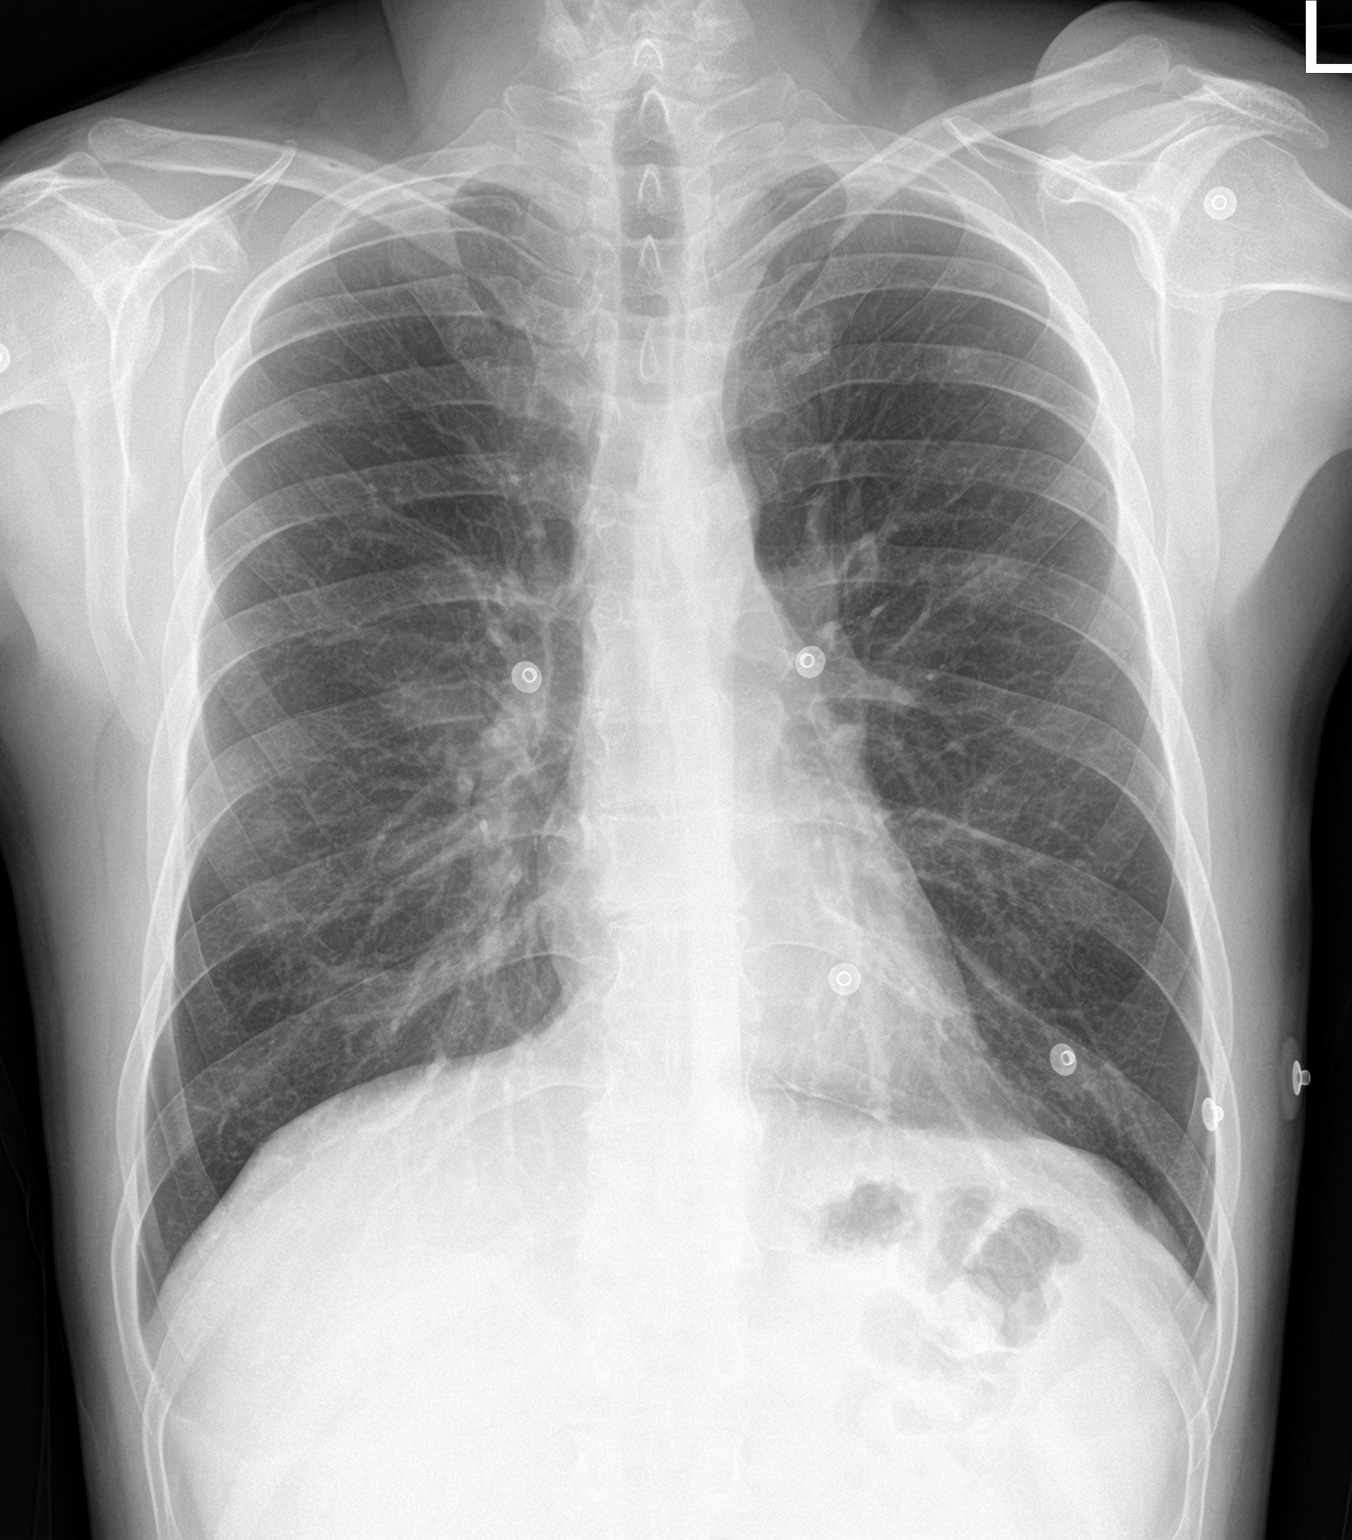

[chest lat]
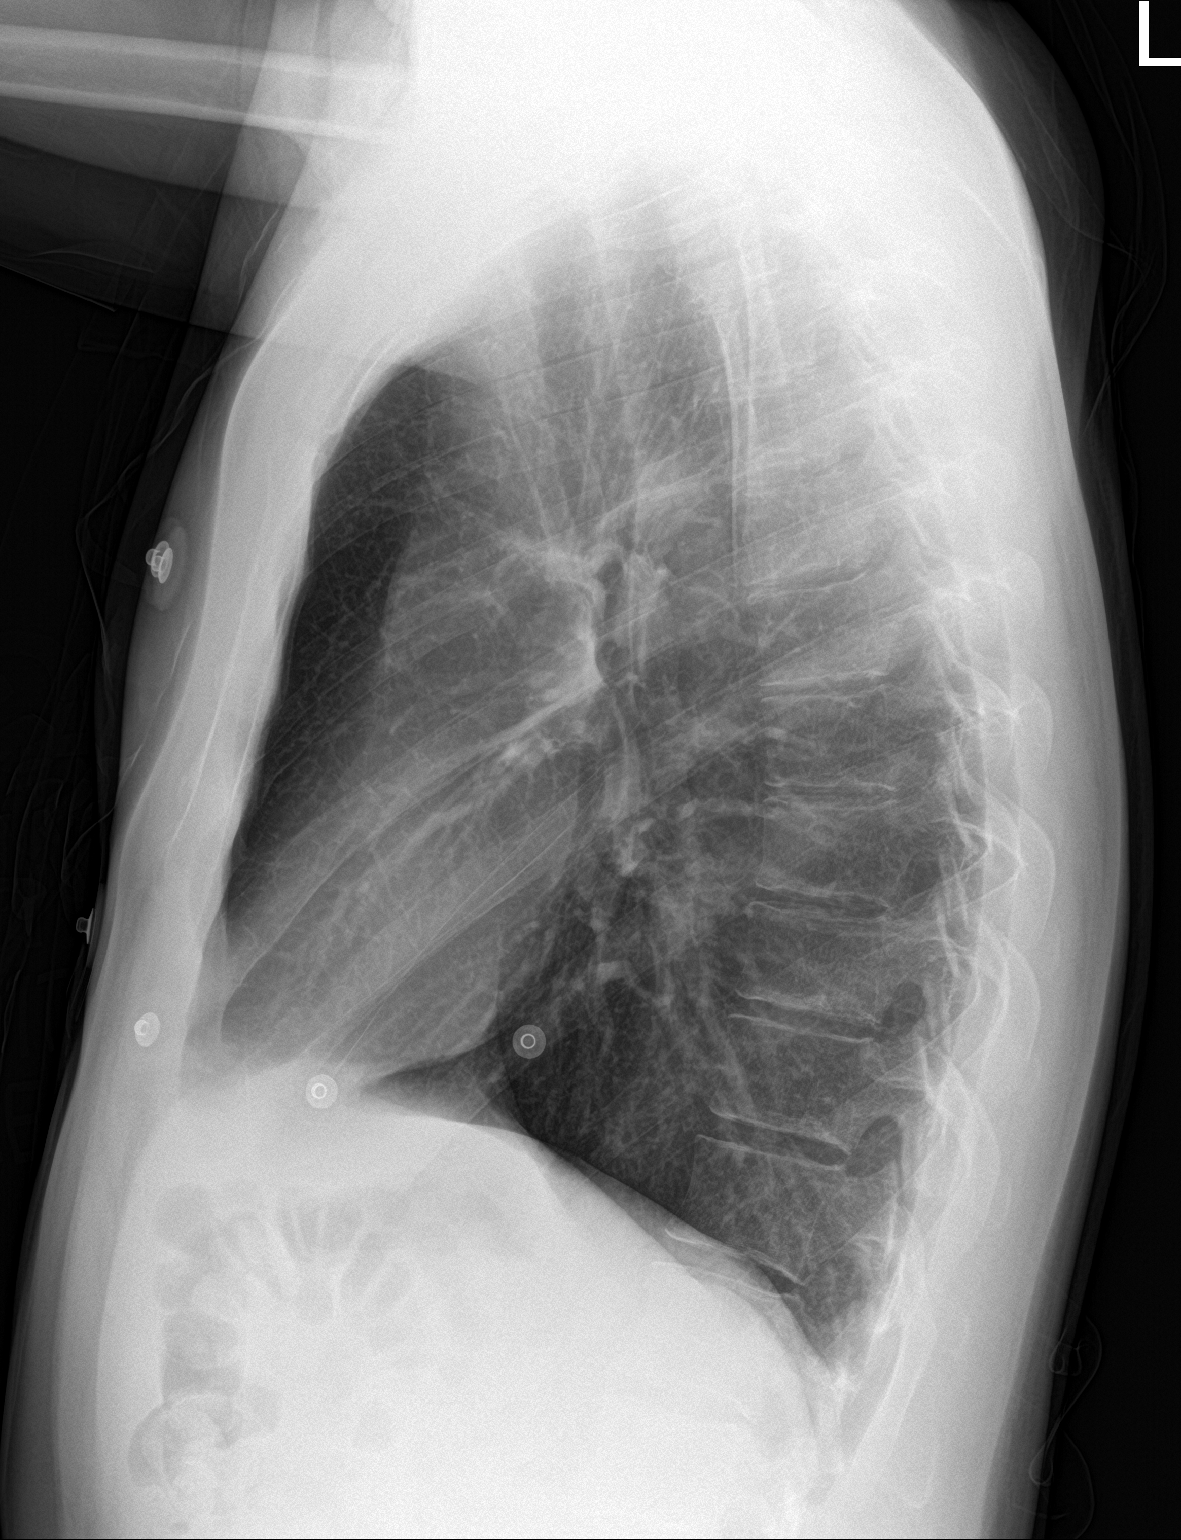

[2 of 2 positions shown; findings below may reference images not displayed]

FINDINGS: The heart size and mediastinal contours are within normal limits.
Both lungs are clear. The visualized skeletal structures are
unremarkable.
IMPRESSION: No active cardiopulmonary disease.
